# Patient Record
Sex: Female | Born: 1980 | Race: White | Hispanic: No | Marital: Married | Smoking: Never smoker
Health system: Southern US, Community
[De-identification: ages and names within clinical notes are randomized; demographics above are authoritative.]

## PROBLEM LIST (undated history)

## (undated) DIAGNOSIS — Z789 Other specified health status: Secondary | ICD-10-CM

## (undated) HISTORY — PX: NO PAST SURGERIES: SHX2092

---

## 2014-11-26 LAB — OB RESULTS CONSOLE ABO/RH: RH TYPE: POSITIVE

## 2014-11-26 LAB — OB RESULTS CONSOLE HIV ANTIBODY (ROUTINE TESTING): HIV: NONREACTIVE

## 2014-11-26 LAB — OB RESULTS CONSOLE HEPATITIS B SURFACE ANTIGEN: Hepatitis B Surface Ag: NEGATIVE

## 2014-11-26 LAB — OB RESULTS CONSOLE ANTIBODY SCREEN: Antibody Screen: NEGATIVE

## 2014-11-26 LAB — OB RESULTS CONSOLE RPR: RPR: NONREACTIVE

## 2014-11-26 LAB — OB RESULTS CONSOLE RUBELLA ANTIBODY, IGM: RUBELLA: IMMUNE

## 2014-11-29 LAB — OB RESULTS CONSOLE GC/CHLAMYDIA
Chlamydia: NEGATIVE
GC PROBE AMP, GENITAL: NEGATIVE

## 2015-04-03 NOTE — L&D Delivery Note (Signed)
Operative Delivery Note At 7:13 PM a viable and healthy female was delivered via Vaginal, Vacuum Investment banker, operational(Extractor).  Presentation: vertex; Position: Right,, Occiput,, Posterior; Station: +4.  Verbal consent: obtained from patient.  Risks and benefits discussed in detail.  Risks include, but are not limited to the risks of anesthesia, bleeding, infection, damage to maternal tissues, fetal cephalhematoma.  There is also the risk of inability to effect vaginal delivery of the head, or shoulder dystocia that cannot be resolved by established maneuvers, leading to the need for emergency cesarean section.  APGAR: 5, 7; weight  pending.   Placenta status: Intact, Spontaneous.   Cord: 3 vessels with the following complications: None.  Cord pH: na  Anesthesia: Epidural  Instruments: kiwi x 4 pulls , 2 pop offs Episiotomy: Median Lacerations: 3rd degree;Perineal Suture Repair: 2.0 vicryl rapide and 0 vicryl Est. Blood Loss (mL):  300  Mom to postpartum.  Baby to Couplet care / Skin to Skin.  Claire Holland 06/22/2015, 7:37 PM

## 2015-04-27 ENCOUNTER — Other Ambulatory Visit (HOSPITAL_COMMUNITY): Payer: Self-pay | Admitting: Obstetrics and Gynecology

## 2015-04-27 DIAGNOSIS — Z3689 Encounter for other specified antenatal screening: Secondary | ICD-10-CM

## 2015-04-27 DIAGNOSIS — O4402 Placenta previa specified as without hemorrhage, second trimester: Secondary | ICD-10-CM

## 2015-05-02 ENCOUNTER — Ambulatory Visit (HOSPITAL_COMMUNITY)
Admission: RE | Admit: 2015-05-02 | Discharge: 2015-05-02 | Disposition: A | Discharge status: Home / Self Care | Payer: BC Managed Care – PPO | Source: Ambulatory Visit | Attending: Obstetrics and Gynecology | Admitting: Obstetrics and Gynecology

## 2015-05-02 ENCOUNTER — Other Ambulatory Visit (HOSPITAL_COMMUNITY): Payer: Self-pay | Admitting: Obstetrics and Gynecology

## 2015-05-02 ENCOUNTER — Encounter (HOSPITAL_COMMUNITY): Payer: Self-pay

## 2015-05-02 DIAGNOSIS — Z3A33 33 weeks gestation of pregnancy: Secondary | ICD-10-CM

## 2015-05-02 DIAGNOSIS — Z3689 Encounter for other specified antenatal screening: Secondary | ICD-10-CM

## 2015-05-02 DIAGNOSIS — O4402 Placenta previa specified as without hemorrhage, second trimester: Secondary | ICD-10-CM

## 2015-05-02 DIAGNOSIS — O4443 Low lying placenta NOS or without hemorrhage, third trimester: Secondary | ICD-10-CM | POA: Diagnosis not present

## 2015-05-02 DIAGNOSIS — O4440 Low lying placenta NOS or without hemorrhage, unspecified trimester: Secondary | ICD-10-CM

## 2015-05-02 DIAGNOSIS — O283 Abnormal ultrasonic finding on antenatal screening of mother: Secondary | ICD-10-CM | POA: Insufficient documentation

## 2015-05-02 DIAGNOSIS — O09522 Supervision of elderly multigravida, second trimester: Secondary | ICD-10-CM

## 2015-05-02 DIAGNOSIS — O289 Unspecified abnormal findings on antenatal screening of mother: Secondary | ICD-10-CM

## 2015-05-02 HISTORY — DX: Other specified health status: Z78.9

## 2015-05-17 LAB — OB RESULTS CONSOLE GBS: STREP GROUP B AG: NEGATIVE

## 2015-06-06 ENCOUNTER — Encounter (HOSPITAL_COMMUNITY): Payer: Self-pay

## 2015-06-16 ENCOUNTER — Inpatient Hospital Stay (HOSPITAL_COMMUNITY): Admission: AD | Admit: 2015-06-16 | Payer: Self-pay | Source: Ambulatory Visit | Admitting: Obstetrics and Gynecology

## 2015-06-21 ENCOUNTER — Other Ambulatory Visit: Payer: Self-pay | Admitting: Obstetrics and Gynecology

## 2015-06-21 NOTE — H&P (Signed)
Claire Holland is a 35 y.o. female presenting for IOL due to postdates and polyhydramnios. Maternal Medical History:  Reason for admission: Contractions.   Contractions: Onset was less than 1 hour ago.   Frequency: rare.   Perceived severity is mild.    Fetal activity: Perceived fetal activity is normal.   Last perceived fetal movement was within the past hour.    Prenatal complications: Placental abnormality and polyhydramnios.   Prenatal Complications - Diabetes: none.    OB History    Gravida Para Term Preterm AB TAB SAB Ectopic Multiple Living   2 1 1       1      Past Medical History  Diagnosis Date  . Medical history non-contributory    Past Surgical History  Procedure Laterality Date  . No past surgeries     Family History: family history is not on file. Social History:  reports that she has never smoked. She does not have any smokeless tobacco history on file. She reports that she does not drink alcohol or use illicit drugs.   Prenatal Transfer Tool  Maternal Diabetes: No Genetic Screening: Normal Maternal Ultrasounds/Referrals: Abnormal:  Findings:   Other: marginal pci Fetal Ultrasounds or other Referrals:  None Maternal Substance Abuse:  No Significant Maternal Medications:  None Significant Maternal Lab Results:  None Other Comments:  None  Review of Systems  Constitutional: Negative.   All other systems reviewed and are negative.     Last menstrual period 09/09/2014. Maternal Exam:  Uterine Assessment: Contraction strength is mild.  Contraction frequency is rare.   Abdomen: Patient reports no abdominal tenderness. Fetal presentation: vertex  Introitus: Normal vulva. Normal vagina.  Ferning test: not done.  Nitrazine test: not done. Amniotic fluid character: not assessed.  Pelvis: questionable for delivery.   Cervix: Cervix evaluated by digital exam.     Physical Exam  Nursing note and vitals reviewed. Constitutional: She is oriented to  person, place, and time. She appears well-developed and well-nourished.  HENT:  Head: Normocephalic and atraumatic.  Neck: Normal range of motion. Neck supple.  Cardiovascular: Normal rate and regular rhythm.   Respiratory: Effort normal and breath sounds normal.  GI: Soft. Bowel sounds are normal.  Genitourinary: Vagina normal and uterus normal.  Musculoskeletal: Normal range of motion.  Neurological: She is alert and oriented to person, place, and time. She has normal reflexes.  Skin: Skin is warm and dry.  Psychiatric: She has a normal mood and affect.    Prenatal labs: ABO, Rh:   Antibody:   Rubella:   RPR:    HBsAg:    HIV:    GBS:     Assessment/Plan: Postdates Moderate poly Marginal PCI Admit for IOL   Claire Holland J 06/21/2015, 1:40 PM

## 2015-06-22 ENCOUNTER — Inpatient Hospital Stay (HOSPITAL_COMMUNITY): Payer: BC Managed Care – PPO | Admitting: Anesthesiology

## 2015-06-22 ENCOUNTER — Inpatient Hospital Stay (HOSPITAL_COMMUNITY)
Admission: AD | Admit: 2015-06-22 | Discharge: 2015-06-24 | DRG: 775 | Disposition: A | Discharge status: Home / Self Care | Payer: BC Managed Care – PPO | Source: Ambulatory Visit | Attending: Obstetrics and Gynecology | Admitting: Obstetrics and Gynecology

## 2015-06-22 ENCOUNTER — Encounter (HOSPITAL_COMMUNITY): Payer: Self-pay | Admitting: *Deleted

## 2015-06-22 DIAGNOSIS — Z3A40 40 weeks gestation of pregnancy: Secondary | ICD-10-CM

## 2015-06-22 DIAGNOSIS — O403XX0 Polyhydramnios, third trimester, not applicable or unspecified: Secondary | ICD-10-CM | POA: Diagnosis present

## 2015-06-22 DIAGNOSIS — O43123 Velamentous insertion of umbilical cord, third trimester: Secondary | ICD-10-CM | POA: Diagnosis present

## 2015-06-22 DIAGNOSIS — O480 Post-term pregnancy: Secondary | ICD-10-CM | POA: Diagnosis present

## 2015-06-22 DIAGNOSIS — O409XX0 Polyhydramnios, unspecified trimester, not applicable or unspecified: Secondary | ICD-10-CM | POA: Diagnosis present

## 2015-06-22 LAB — CBC
HEMATOCRIT: 42.1 % (ref 36.0–46.0)
Hemoglobin: 14.9 g/dL (ref 12.0–15.0)
MCH: 29.6 pg (ref 26.0–34.0)
MCHC: 35.4 g/dL (ref 30.0–36.0)
MCV: 83.7 fL (ref 78.0–100.0)
Platelets: 216 10*3/uL (ref 150–400)
RBC: 5.03 MIL/uL (ref 3.87–5.11)
RDW: 13.8 % (ref 11.5–15.5)
WBC: 8 10*3/uL (ref 4.0–10.5)

## 2015-06-22 LAB — TYPE AND SCREEN
ABO/RH(D): O POS
ANTIBODY SCREEN: NEGATIVE

## 2015-06-22 LAB — ABO/RH: ABO/RH(D): O POS

## 2015-06-22 LAB — RPR: RPR Ser Ql: NONREACTIVE

## 2015-06-22 MED ORDER — MISOPROSTOL 25 MCG QUARTER TABLET
25.0000 ug | ORAL_TABLET | ORAL | Status: DC | PRN
  Filled 2015-06-22: qty 0.25

## 2015-06-22 MED ORDER — LACTATED RINGERS IV SOLN
500.0000 mL | Freq: Once | INTRAVENOUS | Status: AC
  Administered 2015-06-22: 500 mL via INTRAVENOUS

## 2015-06-22 MED ORDER — IBUPROFEN 600 MG PO TABS
600.0000 mg | ORAL_TABLET | Freq: Four times a day (QID) | ORAL | Status: DC
  Administered 2015-06-23 – 2015-06-24 (×5): 600 mg via ORAL
  Filled 2015-06-22 (×5): qty 1

## 2015-06-22 MED ORDER — METHYLERGONOVINE MALEATE 0.2 MG/ML IJ SOLN: 0.2000 mg | INTRAMUSCULAR | Status: DC | PRN

## 2015-06-22 MED ORDER — WITCH HAZEL-GLYCERIN EX PADS: 1.0000 "application " | MEDICATED_PAD | CUTANEOUS | Status: DC | PRN

## 2015-06-22 MED ORDER — TETANUS-DIPHTH-ACELL PERTUSSIS 5-2.5-18.5 LF-MCG/0.5 IM SUSP
0.5000 mL | Freq: Once | INTRAMUSCULAR | Status: DC
  Filled 2015-06-22: qty 0.5

## 2015-06-22 MED ORDER — FENTANYL 2.5 MCG/ML BUPIVACAINE 1/10 % EPIDURAL INFUSION (WH - ANES)
14.0000 mL/h | INTRAMUSCULAR | Status: DC | PRN
  Administered 2015-06-22 (×3): 14 mL/h via EPIDURAL
  Filled 2015-06-22 (×2): qty 125

## 2015-06-22 MED ORDER — PHENYLEPHRINE 40 MCG/ML (10ML) SYRINGE FOR IV PUSH (FOR BLOOD PRESSURE SUPPORT)
80.0000 ug | PREFILLED_SYRINGE | INTRAVENOUS | Status: DC | PRN
  Filled 2015-06-22: qty 20

## 2015-06-22 MED ORDER — EPHEDRINE 5 MG/ML INJ: 10.0000 mg | INTRAVENOUS | Status: DC | PRN

## 2015-06-22 MED ORDER — ACETAMINOPHEN 325 MG PO TABS
650.0000 mg | ORAL_TABLET | ORAL | Status: DC | PRN
  Filled 2015-06-22: qty 2

## 2015-06-22 MED ORDER — OXYCODONE-ACETAMINOPHEN 5-325 MG PO TABS: 1.0000 | ORAL_TABLET | ORAL | Status: DC | PRN

## 2015-06-22 MED ORDER — OXYTOCIN 10 UNIT/ML IJ SOLN
1.0000 m[IU]/min | INTRAVENOUS | Status: DC
  Filled 2015-06-22: qty 10

## 2015-06-22 MED ORDER — METHYLERGONOVINE MALEATE 0.2 MG PO TABS: 0.2000 mg | ORAL_TABLET | ORAL | Status: DC | PRN

## 2015-06-22 MED ORDER — OXYTOCIN 10 UNIT/ML IJ SOLN: 2.5000 [IU]/h | INTRAVENOUS | Status: DC

## 2015-06-22 MED ORDER — OXYTOCIN BOLUS FROM INFUSION: 500.0000 mL | INTRAVENOUS | Status: DC

## 2015-06-22 MED ORDER — FLEET ENEMA 7-19 GM/118ML RE ENEM: 1.0000 | ENEMA | RECTAL | Status: DC | PRN

## 2015-06-22 MED ORDER — PHENYLEPHRINE 40 MCG/ML (10ML) SYRINGE FOR IV PUSH (FOR BLOOD PRESSURE SUPPORT): 80.0000 ug | PREFILLED_SYRINGE | INTRAVENOUS | Status: DC | PRN

## 2015-06-22 MED ORDER — SENNOSIDES-DOCUSATE SODIUM 8.6-50 MG PO TABS
2.0000 | ORAL_TABLET | ORAL | Status: DC
  Administered 2015-06-23: 2 via ORAL
  Filled 2015-06-22: qty 2

## 2015-06-22 MED ORDER — LANOLIN HYDROUS EX OINT: TOPICAL_OINTMENT | CUTANEOUS | Status: DC | PRN

## 2015-06-22 MED ORDER — BENZOCAINE-MENTHOL 20-0.5 % EX AERO
1.0000 "application " | INHALATION_SPRAY | CUTANEOUS | Status: DC | PRN
  Administered 2015-06-23: 1 via TOPICAL
  Filled 2015-06-22 (×2): qty 56

## 2015-06-22 MED ORDER — ONDANSETRON HCL 4 MG PO TABS: 4.0000 mg | ORAL_TABLET | ORAL | Status: DC | PRN

## 2015-06-22 MED ORDER — DIPHENHYDRAMINE HCL 25 MG PO CAPS: 25.0000 mg | ORAL_CAPSULE | Freq: Four times a day (QID) | ORAL | Status: DC | PRN

## 2015-06-22 MED ORDER — FENTANYL CITRATE (PF) 100 MCG/2ML IJ SOLN: 50.0000 ug | INTRAMUSCULAR | Status: DC | PRN

## 2015-06-22 MED ORDER — LACTATED RINGERS IV SOLN
INTRAVENOUS | Status: DC
Start: 2015-06-22 — End: 2015-06-22
  Administered 2015-06-22: 02:00:00 via INTRAVENOUS

## 2015-06-22 MED ORDER — CITRIC ACID-SODIUM CITRATE 334-500 MG/5ML PO SOLN
30.0000 mL | ORAL | Status: DC | PRN
  Filled 2015-06-22: qty 15

## 2015-06-22 MED ORDER — SIMETHICONE 80 MG PO CHEW: 80.0000 mg | CHEWABLE_TABLET | ORAL | Status: DC | PRN

## 2015-06-22 MED ORDER — LACTATED RINGERS IV SOLN
500.0000 mL | INTRAVENOUS | Status: DC | PRN
  Administered 2015-06-22 (×2): 500 mL via INTRAVENOUS

## 2015-06-22 MED ORDER — ZOLPIDEM TARTRATE 5 MG PO TABS: 5.0000 mg | ORAL_TABLET | Freq: Every evening | ORAL | Status: DC | PRN

## 2015-06-22 MED ORDER — OXYTOCIN 10 UNIT/ML IJ SOLN
1.0000 m[IU]/min | INTRAVENOUS | Status: DC
  Administered 2015-06-22: 2 m[IU]/min via INTRAVENOUS

## 2015-06-22 MED ORDER — ONDANSETRON HCL 4 MG/2ML IJ SOLN: 4.0000 mg | Freq: Four times a day (QID) | INTRAMUSCULAR | Status: DC | PRN

## 2015-06-22 MED ORDER — TERBUTALINE SULFATE 1 MG/ML IJ SOLN
0.2500 mg | Freq: Once | INTRAMUSCULAR | Status: AC | PRN
  Administered 2015-06-22: 0.25 mg via SUBCUTANEOUS

## 2015-06-22 MED ORDER — LIDOCAINE HCL (PF) 1 % IJ SOLN
INTRAMUSCULAR | Status: DC | PRN
  Administered 2015-06-22: 8 mL
  Administered 2015-06-22: 8 mL via EPIDURAL

## 2015-06-22 MED ORDER — DIPHENHYDRAMINE HCL 50 MG/ML IJ SOLN: 12.5000 mg | INTRAMUSCULAR | Status: DC | PRN

## 2015-06-22 MED ORDER — PRENATAL MULTIVITAMIN CH
1.0000 | ORAL_TABLET | Freq: Every day | ORAL | Status: DC
  Administered 2015-06-23 – 2015-06-24 (×2): 1 via ORAL
  Filled 2015-06-22 (×2): qty 1

## 2015-06-22 MED ORDER — ACETAMINOPHEN 325 MG PO TABS: 650.0000 mg | ORAL_TABLET | ORAL | Status: DC | PRN

## 2015-06-22 MED ORDER — ONDANSETRON HCL 4 MG/2ML IJ SOLN: 4.0000 mg | INTRAMUSCULAR | Status: DC | PRN

## 2015-06-22 MED ORDER — TERBUTALINE SULFATE 1 MG/ML IJ SOLN
0.2500 mg | Freq: Once | INTRAMUSCULAR | Status: DC | PRN
  Filled 2015-06-22: qty 1

## 2015-06-22 MED ORDER — ZOLPIDEM TARTRATE 5 MG PO TABS
5.0000 mg | ORAL_TABLET | Freq: Every evening | ORAL | Status: DC | PRN
Start: 2015-06-22 — End: 2015-06-22

## 2015-06-22 MED ORDER — OXYCODONE-ACETAMINOPHEN 5-325 MG PO TABS: 2.0000 | ORAL_TABLET | ORAL | Status: DC | PRN

## 2015-06-22 MED ORDER — LIDOCAINE HCL (PF) 1 % IJ SOLN: 30.0000 mL | INTRAMUSCULAR | Status: DC | PRN

## 2015-06-22 MED ORDER — DIBUCAINE 1 % RE OINT
1.0000 "application " | TOPICAL_OINTMENT | RECTAL | Status: DC | PRN
  Filled 2015-06-22: qty 28

## 2015-06-22 MED ORDER — OXYTOCIN 10 UNIT/ML IJ SOLN: 1.0000 m[IU]/min | INTRAVENOUS | Status: DC

## 2015-06-22 NOTE — Anesthesia Procedure Notes (Signed)
Epidural Patient location during procedure: OB Start time: 06/22/2015 9:12 AM End time: 06/22/2015 9:16 AM  Staffing Anesthesiologist: Leilani AbleHATCHETT, Latorie Montesano Performed by: anesthesiologist   Preanesthetic Checklist Completed: patient identified, site marked, surgical consent, pre-op evaluation, timeout performed, IV checked, risks and benefits discussed and monitors and equipment checked  Epidural Patient position: sitting Prep: site prepped and draped and DuraPrep Patient monitoring: continuous pulse ox and blood pressure Approach: midline Location: L3-L4 Injection technique: LOR air  Needle:  Needle type: Tuohy  Needle gauge: 17 G Needle length: 9 cm and 9 Needle insertion depth: 5 cm cm Catheter type: closed end flexible Catheter size: 19 Gauge Catheter at skin depth: 10 cm Test dose: negative and Other  Assessment Sensory level: T9 Events: blood not aspirated, injection not painful, no injection resistance, negative IV test and no paresthesia

## 2015-06-22 NOTE — Progress Notes (Signed)
Claire Holland is a 35 y.o. G2P1001 at 7237w6d by LMP admitted for induction of labor due to poly and marginal pci.  Subjective: comfortable  Objective: BP 109/62 mmHg  Pulse 76  Temp(Src) 97.9 F (36.6 C) (Oral)  Resp 18  Ht 5\' 1"  (1.549 m)  Wt 68.04 kg (150 lb)  BMI 28.36 kg/m2  SpO2 100%  LMP 09/09/2014      FHT:  FHR: 135 bpm, variability: moderate,  accelerations:  Present,  decelerations:  Present occ early  Previously noted bradycardia of ? etx now resolved. UC:   irregular, every 2-5 minutes SVE:   Dilation: 5 Effacement (%): 100 Station: 0, +1 Exam by:: Dr.Issabela Lesko  IUPC placed without difficulty  Labs: Lab Results  Component Value Date   WBC 8.0 06/22/2015   HGB 14.9 06/22/2015   HCT 42.1 06/22/2015   MCV 83.7 06/22/2015   PLT 216 06/22/2015    Assessment / Plan: Induction of labor due to marginal pci,  progressing well on pitocin  Labor: Progressing normally Preeclampsia:  no signs or symptoms of toxicity Fetal Wellbeing:  Category I Pain Control:  Epidural I/D:  n/a Anticipated MOD:  guarded  Leonardo Plaia J 06/22/2015, 12:50 PM

## 2015-06-22 NOTE — Progress Notes (Signed)
Claire Holland is a 35 y.o. G2P1001 at 6522w6d by LMP admitted for induction of labor due to marginal pci, postdates and poly.  Subjective: Mild contractions  Objective: BP 103/65 mmHg  Pulse 70  Temp(Src) 97.9 F (36.6 C) (Oral)  Resp 18  Ht 5\' 1"  (1.549 m)  Wt 68.04 kg (150 lb)  BMI 28.36 kg/m2  LMP 09/09/2014      FHT:  FHR: 145 bpm, variability: moderate,  accelerations:  Present,  decelerations:  Absent UC:   regular, every 2 minutes SVE:   2/80/-1 AROM clear  Labs: Lab Results  Component Value Date   WBC 8.0 06/22/2015   HGB 14.9 06/22/2015   HCT 42.1 06/22/2015   MCV 83.7 06/22/2015   PLT 216 06/22/2015    Assessment / Plan: Induction of labor due to postterm,  progressing well on pitocin  Labor: Progressing normally Preeclampsia:  no signs or symptoms of toxicity Fetal Wellbeing:  Category I Pain Control:  Labor support without medications I/D:  n/a Anticipated MOD:  guarded  Joanell Cressler J 06/22/2015, 7:44 AM

## 2015-06-22 NOTE — Anesthesia Preprocedure Evaluation (Signed)

## 2015-06-23 ENCOUNTER — Encounter (HOSPITAL_COMMUNITY): Payer: Self-pay | Admitting: Obstetrics and Gynecology

## 2015-06-23 LAB — CBC
HCT: 30 % — ABNORMAL LOW (ref 36.0–46.0)
HEMOGLOBIN: 10.2 g/dL — AB (ref 12.0–15.0)
MCH: 29.1 pg (ref 26.0–34.0)
MCHC: 34 g/dL (ref 30.0–36.0)
MCV: 85.7 fL (ref 78.0–100.0)
Platelets: 143 10*3/uL — ABNORMAL LOW (ref 150–400)
RBC: 3.5 MIL/uL — ABNORMAL LOW (ref 3.87–5.11)
RDW: 14.3 % (ref 11.5–15.5)
WBC: 20.2 10*3/uL — AB (ref 4.0–10.5)

## 2015-06-23 LAB — CCBB MATERNAL DONOR DRAW

## 2015-06-23 MED ORDER — INFLUENZA VAC SPLIT QUAD 0.5 ML IM SUSY
0.5000 mL | PREFILLED_SYRINGE | INTRAMUSCULAR | Status: AC
  Administered 2015-06-24: 0.5 mL via INTRAMUSCULAR
  Filled 2015-06-23: qty 0.5

## 2015-06-23 NOTE — Progress Notes (Signed)
Patient ID: Claire HuntsmanMeagan N Holland, female   DOB: 02/22/1981, 35 y.o.   MRN: [id number] PPD # 1 SVD  S:  Reports feeling well             Tolerating po/ No nausea or vomiting             Bleeding is light             Pain minimally controlled with ibuprofen (OTC)             Up ad lib / ambulatory / voiding without difficulties    Newborn  Information for the patient's newborn:  Claire Holland, Boy Claire Holland [[id number]]  female  breast feeding  / Circumcision planning   O:  A & O x 3, in no apparent distress              VS:  Filed Vitals:   06/22/15 2058 06/22/15 2130 06/22/15 2245 06/23/15 0300  BP:  118/56 116/48 92/56  Pulse:  90 70 78  Temp:  98.1 F (36.7 C) 98.4 F (36.9 C) 97.8 F (36.6 C)  TempSrc:  Oral Oral Oral  Resp: 16 18 18 18   Height:      Weight:      SpO2:  99% 98% 98%    LABS:  Recent Labs  06/22/15 0200 06/23/15 0607  WBC 8.0 20.2*  HGB 14.9 10.2*  HCT 42.1 30.0*  PLT 216 143*    Blood type: --/--/O POS, O POS (03/22 0200)  Rubella: Immune (08/26 0000)   I&O: I/O last 3 completed shifts: In: -  Out: 800 [Urine:500; Blood:300]             Lungs: Clear and unlabored  Heart: regular rate and rhythm / no murmurs  Abdomen: soft, non-tender, non-distended             Fundus: firm, non-tender, U-even  Perineum: 3rd degree repair healing well - no edema  Lochia: minimal  Extremities: Trace pedal edema, no calf pain or tenderness, No Homans    A/P: PPD # 1  35 y.o., [id number]   Principal Problem:   Postpartum care following vaginal delivery (3/22) Active Problems:   Polyhydramnios   Third degree laceration of perineum during delivery, postpartum   Doing well - stable status  Routine post partum orders  Reminded of Percocet order prn  Continue stool softener  Anticipate discharge tomorrow    Raelyn MoraAWSON, Alessio Bogan, M, MSN, CNM 06/23/2015, 8:29 AM

## 2015-06-23 NOTE — Lactation Note (Signed)
This note was copied from a baby's chart. Lactation Consultation Note Follow up visit at 20 hours of age.  Mom has baby latched now and baby was more eager at beginning of feeding.  Mom stimulating baby now to maintain feeding.  Mom reports circumcision was done this am and baby has been sleepy.  Encouraged mom to call for assist as needed and discussed cluster feeding.   Patient Name: Claire Holland QMVHQ'IToday's Date: 06/23/2015 Reason for consult: Follow-up assessment   Maternal Data Has patient been taught Hand Expression?: Yes Does the patient have breastfeeding experience prior to this delivery?: No  Feeding Feeding Type: Breast Fed Length of feed:  (15 minutes)  LATCH Score/Interventions Latch: Grasps breast easily, tongue down, lips flanged, rhythmical sucking. Intervention(s): Adjust position;Assist with latch;Breast massage;Breast compression  Audible Swallowing: A few with stimulation Intervention(s): Skin to skin;Hand expression;Alternate breast massage  Type of Nipple: Everted at rest and after stimulation  Comfort (Breast/Nipple): Soft / non-tender     Hold (Positioning): No assistance needed to correctly position infant at breast. Intervention(s): Breastfeeding basics reviewed;Support Pillows;Position options;Skin to skin  LATCH Score: 9  Lactation Tools Discussed/Used     Consult Status Consult Status: Follow-up Follow-up type: In-patient    Tyffany Waldrop, Arvella MerlesJana Lynn 06/23/2015, 4:00 PM

## 2015-06-23 NOTE — Anesthesia Postprocedure Evaluation (Signed)
Anesthesia Post Note  Patient: Claire Holland  Procedure(s) Performed: * No procedures listed *  Patient location during evaluation: Mother Baby Anesthesia Type: Epidural Level of consciousness: awake, awake and alert, oriented and patient cooperative Pain management: pain level controlled Vital Signs Assessment: post-procedure vital signs reviewed and stable Respiratory status: spontaneous breathing, nonlabored ventilation and respiratory function stable Cardiovascular status: stable Postop Assessment: no headache, no backache, patient able to bend at knees and no signs of nausea or vomiting Anesthetic complications: no    Last Vitals:  Filed Vitals:   06/22/15 2245 06/23/15 0300  BP: 116/48 92/56  Pulse: 70 78  Temp: 36.9 C 36.6 C  Resp: 18 18    Last Pain:  Filed Vitals:   06/23/15 0352  PainSc: 0-No pain                 Shannia Jacuinde L

## 2015-06-23 NOTE — Lactation Note (Addendum)
This note was copied from a baby's chart. Lactation Consultation Note Mom's 2nd baby but didn't BF her 1st baby. Mom states this baby is latch really well w/o pain. Mom is BF in cradle and football positions. Educated about newborn behavior, STS, I&O, cluster feeding, supply and demand. Mom encouraged to feed baby 8-12 times/24 hours and with feeding cues. Mom encouraged to waken baby for feeds. Mom has everted short shaft nipples but denies painful latches.  Referred to Baby and Me Book in Breastfeeding section Pg. 22-23 for position options and Proper latch demonstration. Mom states RN taught her hand expression and saw colostrum. WH/LC brochure given w/resources, support groups and LC services. Patient Name: Claire Holland WUJWJ'XToday's Date: 06/23/2015 Reason for consult: Initial assessment   Maternal Data Has patient been taught Hand Expression?: Yes Does the patient have breastfeeding experience prior to this delivery?: No  Feeding Feeding Type: Breast Fed Length of feed: 15 min  LATCH Score/Interventions       Type of Nipple: Everted at rest and after stimulation  Comfort (Breast/Nipple): Soft / non-tender     Hold (Positioning): No assistance needed to correctly position infant at breast. Intervention(s): Breastfeeding basics reviewed;Support Pillows;Position options;Skin to skin     Lactation Tools Discussed/Used     Consult Status Consult Status: Follow-up Date: 06/24/15 Follow-up type: In-patient    Brahm Barbeau, Diamond NickelLAURA G 06/23/2015, 5:10 AM

## 2015-06-24 MED ORDER — DOCUSATE SODIUM 100 MG PO CAPS
200.0000 mg | ORAL_CAPSULE | Freq: Two times a day (BID) | ORAL | Status: DC
  Administered 2015-06-24: 200 mg via ORAL
  Filled 2015-06-24 (×2): qty 2

## 2015-06-24 MED ORDER — IBUPROFEN 800 MG PO TABS: 800.0000 mg | ORAL_TABLET | Freq: Three times a day (TID) | ORAL | Status: AC

## 2015-06-24 MED ORDER — IBUPROFEN 800 MG PO TABS: 800.0000 mg | ORAL_TABLET | Freq: Three times a day (TID) | ORAL | Status: DC

## 2015-06-24 MED ORDER — DOCUSATE SODIUM 250 MG PO CAPS: 250.0000 mg | ORAL_CAPSULE | Freq: Two times a day (BID) | ORAL | Status: AC

## 2015-06-24 MED ORDER — MAGNESIUM 400 MG PO TABS: 400.0000 mg | ORAL_TABLET | Freq: Every day | ORAL | Status: AC

## 2015-06-24 NOTE — Progress Notes (Signed)
PPD 2 SVD with 3rd degree repair  S:  Reports feeling ok             Tolerating po/ No nausea or vomiting             Bleeding is light             Pain controlled withmotrin and percocet             Up ad lib / ambulatory / voiding QS  Newborn breast feeding   O:               VS: BP 95/68 mmHg  Pulse 73  Temp(Src) 97.5 F (36.4 C) (Oral)  Resp 16  Ht 5\' 1"  (1.549 m)  Wt 68.04 kg (150 lb)  BMI 28.36 kg/m2  SpO2 100%  LMP 09/09/2014  Breastfeeding? Unknown   LABS:              Recent Labs  06/22/15 0200 06/23/15 0607  WBC 8.0 20.2*  HGB 14.9 10.2*  PLT 216 143*               Blood type: --/--/O POS, O POS (03/22 0200)  Rubella: Immune (08/26 0000)                           Physical Exam:             Alert and oriented X3  Abdomen: soft, non-tender, non-distended              Fundus: firm, non-tender, U-1  Perineum: mild edema  Lochia: light  Extremities: trace edema, no calf pain or tenderness    A: PPD # 2 with 3rd degree repair   Doing well - stable status  P: Routine post partum orders             Stool softeners x 6 weeks  DC home - WOB booklet - instructions reviewed  [name], Tristram Milian CNM, MSN, FACNM 06/24/2015, 7:59 AM

## 2015-06-24 NOTE — Lactation Note (Signed)
This note was copied from a baby's chart. Lactation Consultation Note  Baby latched in cradle position after approx 1 hour,  Baby has been cluster feeding. Answered questions about pumping and going back to work. Reviewed engorgement care and monitoring voids/stools. Encouraged mother to attend support group.  Patient Name: Claire Sanjuan [name] WUJWJ'XToday's Date: 06/24/2015 Reason for consult: Follow-up assessment   Maternal Data    Feeding Feeding Type: Breast Fed Length of feed: 60 min (cluster)  LATCH Score/Interventions Latch: Grasps breast easily, tongue down, lips flanged, rhythmical sucking. Intervention(s): Adjust position;Assist with latch;Breast compression  Audible Swallowing: A few with stimulation Intervention(s): Hand expression  Type of Nipple: Everted at rest and after stimulation  Comfort (Breast/Nipple): Filling, red/small blisters or bruises, mild/mod discomfort  Problem noted: Mild/Moderate discomfort Interventions  (Cracked/bleeding/bruising/blister): Expressed breast milk to nipple Interventions (Mild/moderate discomfort): Hand expression  Hold (Positioning): No assistance needed to correctly position infant at breast. Intervention(s): Breastfeeding basics reviewed;Support Pillows;Position options;Skin to skin  LATCH Score: 8  Lactation Tools Discussed/Used     Consult Status Consult Status: Complete    Hardie PulleyBerkelhammer, Byanca Kasper Boschen 06/24/2015, 8:52 AM

## 2015-06-24 NOTE — Discharge Summary (Signed)
Obstetric Discharge Summary Reason for Admission: induction of labor Prenatal Procedures: none Intrapartum Procedures: spontaneous vaginal delivery with 3rd degree extension from episiotomy / Vac assist /  epidural Postpartum Procedures: none Complications-Operative and Postpartum: 3rd degree perineal laceration HEMOGLOBIN  Date Value Ref Range Status  06/23/2015 10.2* 12.0 - 15.0 g/dL Final    Comment:    DELTA CHECK NOTED REPEATED TO VERIFY    HCT  Date Value Ref Range Status  06/23/2015 30.0* 36.0 - 46.0 % Final    Physical Exam:  General: alert, cooperative and no distress Lochia: appropriate Uterine Fundus: firm Incision: healing well DVT Evaluation: No evidence of DVT seen on physical exam.  Discharge Diagnoses: Term Pregnancy-delivered  Discharge Information: Date: 06/24/2015 Activity: pelvic rest Diet: routine Medications: PNV, Ibuprofen, Colace, Percocet and magnesium Condition: stable Instructions: refer to practice specific booklet Discharge to: home Follow-up Information    Follow up with Claire Holland,Claire J, MD. Schedule an appointment as soon as possible for a visit in 6 weeks.   Specialty:  Obstetrics and Gynecology   Contact information:   [address] [place] [id number] [phone]       Newborn Data: Live born female  Birth Weight: 8 lb 2.5 oz (3700 g) APGAR: 5, 7  Home with mother.  Claire Holland, Claire Holland 06/24/2015, 8:48 AM

## 2017-04-29 IMAGING — US US MFM OB TRANSVAGINAL
1 series · 13 of 28 positions shown · non-contrast
Comparison: none

pm)

Name:       FELIX ARNALDO [facility]                      Visit  05/02/2015 [DATE]
Date:
Infertility
8156 [REDACTED]
1  STRAUSS TRAUT           [id number]       [id number]     [id number]
2  STRAUSS TRAUT           [id number]       [id number]     [id number]
Indications
33 weeks gestation of pregnancy
Detailed fetal anatomic survey                  Z36
Low lying placenta, antepartum
Abnormal first trimester screen for T21, low
risk NIPS
Advanced maternal age multigravida 34,
second trimester
OB History
Gravidity:     2         Term:  1        Prem:    0        SAB:   0
TOP:           0       Ectopic  0        Living:  1
:
Fetal Evaluation
Num Of Fetuses:      1
Fetal Heart          127
Rate(bpm):
Cardiac Activity:    Observed
Presentation:        Cephalic
Placenta:            Posterior, above cervical os
P. Cord Insertion:   Visualized, fundal right
Amniotic Fluid
AFI FV:      Subjectively within normal limits
AFI Sum:     17.01    cm      62  %Tile     Larg Pckt:    7.08   cm
RUQ:   7.08    cm    RLQ:   2.99    cm   LUQ:    3.42    cm   LLQ:    3.52   cm
Biometry
BPD:        80  mm     G. Age:   32w 1d                  CI:        72.69   %    70 - 86
FL/HC:      20.5   %    19.4 -
HC:      298.4  mm     G. Age:   33w 0d          9  %    HC/AC:      0.99        0.96 -
AC:        300  mm     G. Age:   34w 0d        64   %    FL/BPD      76.4   %    71 - 87
FL:       61.1  mm     G. Age:   31w 5d          6  %    FL/AC:      20.4   %    20 - 24
HUM:      54.6  mm     G. Age:   31w 5d        21   %
CM:        7.7  mm
Est.        1777   gm   4 lb 10 oz      48   %
FW:
Gestational Age
LMP:           33w 4d        Date:  09/09/14                  EDD:   06/16/15
U/S Today:     32w 5d                                         EDD:   06/22/15
Best:          33w 4d    Det. By:   LMP  (09/09/14)           EDD:   06/16/15
Anatomy
Cranium:          Appears normal         Aortic Arch:       Appears normal
Fetal Cavum:      Appears normal         Ductal Arch:       Appears normal
Ventricles:       Appears normal         Diaphragm:         Appears normal
Choroid Plexus:   Appears normal         Stomach:           Appears normal,
left sided
Cerebellum:       Appears normal         Abdomen:           Appears normal
Posterior         Appears normal         Abdominal          Not well visualized
Fossa:                                   Wall:
Nuchal Fold:      Not applicable (>20    Cord Vessels:      Appears normal (3
wks GA)                                   vessel cord)
Face:             Appears normal         Kidneys:           Appear normal
(orbits and profile)
Lips:             Appears normal         Bladder:           Appears normal
Fetal Thoracic:   Appears normal         Spine:             Appears normal
Heart:            Not well visualized    Upper              Appears normal
Extremities:
RVOT:             Not well visualized    Lower              Appears normal
LVOT:             Not well visualized
Other:   Technically difficult due to advanced GA and fetal position.
Cervix Uterus Adnexa
Cervix
Length:               3  cm.
Measured transvaginally.
Impression
INDICATION: 34 yr old 6CNTHHT at 99w7d with concern for
low lying placenta for fetal ultrasound.

[Series 1: us mfm ob transvaginal · 80 acquisitions, 13 frames shown]
[im 3/80]
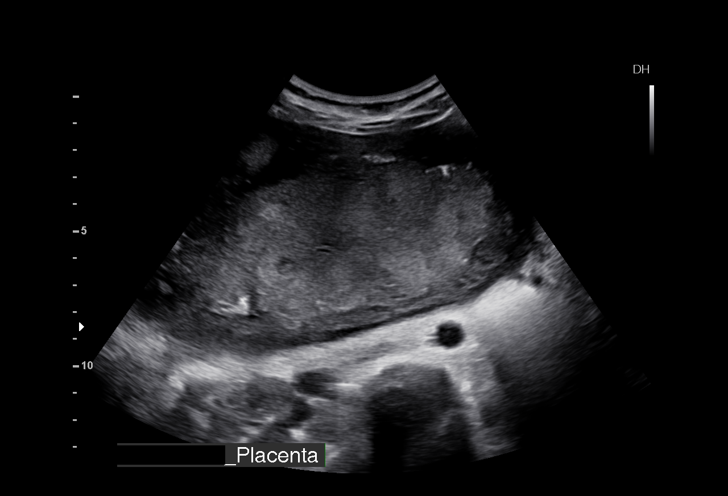
[im 9/80]
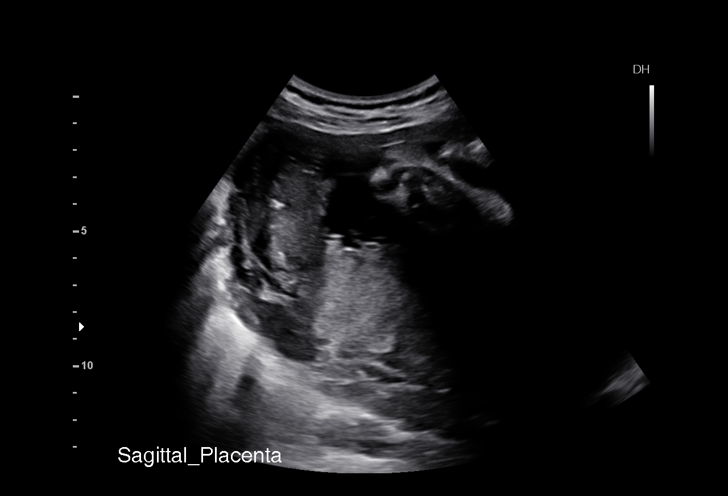
[im 15/80]
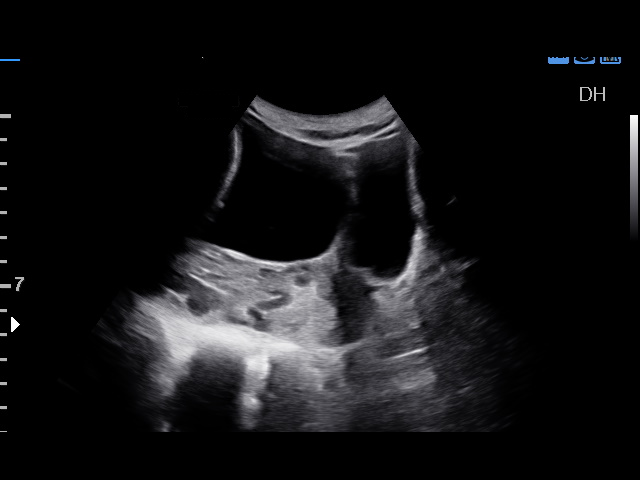
[im 21/80]
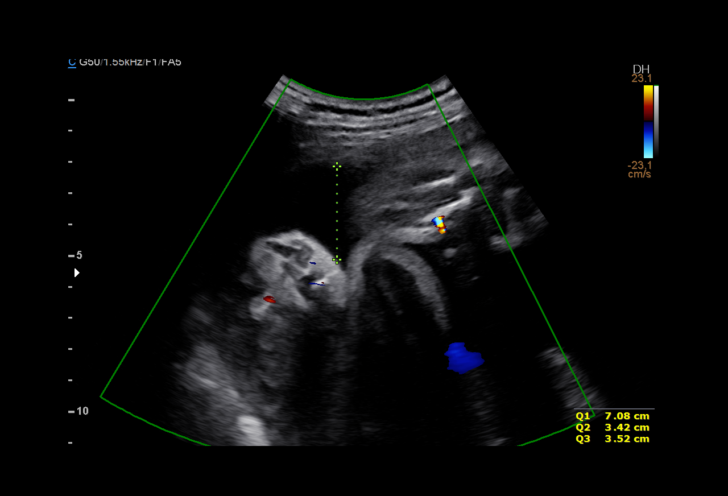
[im 27/80]
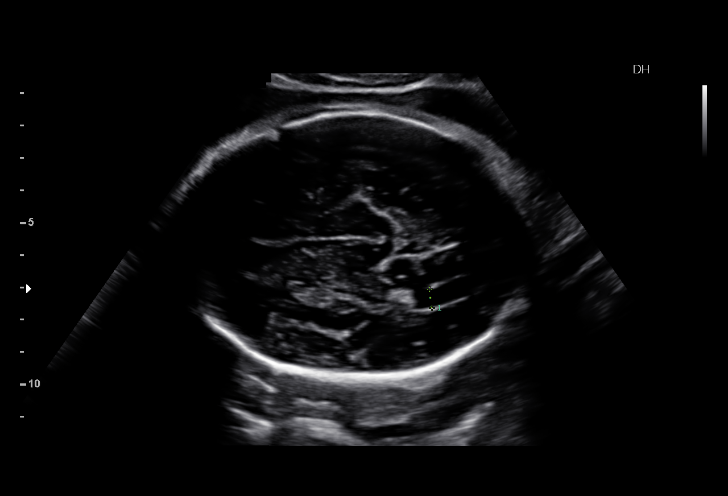
[im 33/80]
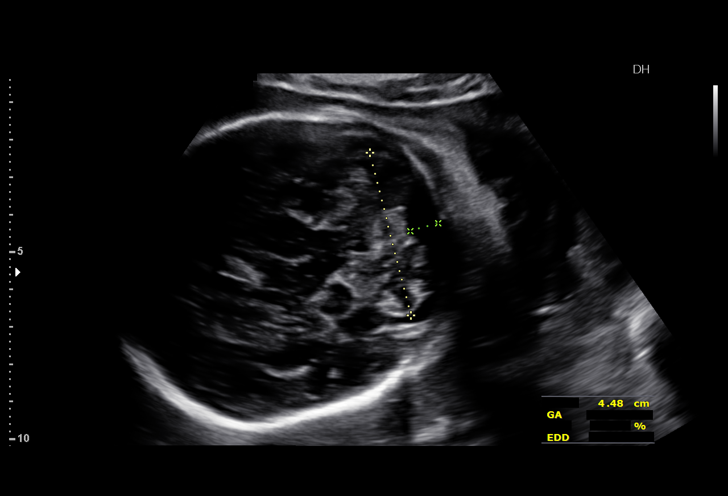
[im 41/80]
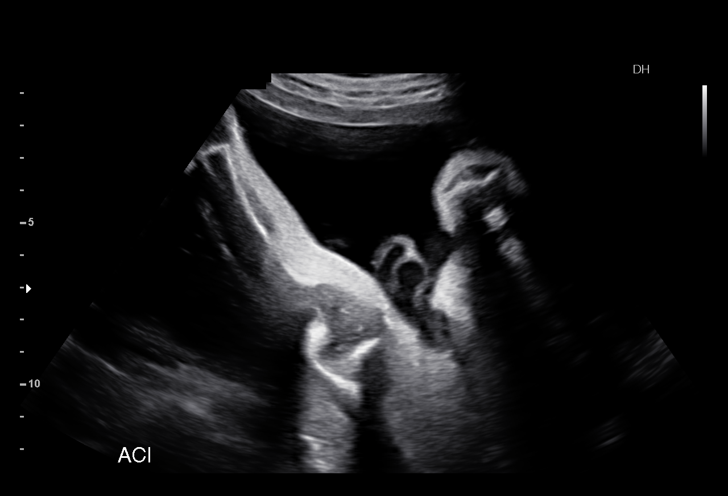
[im 47/80]
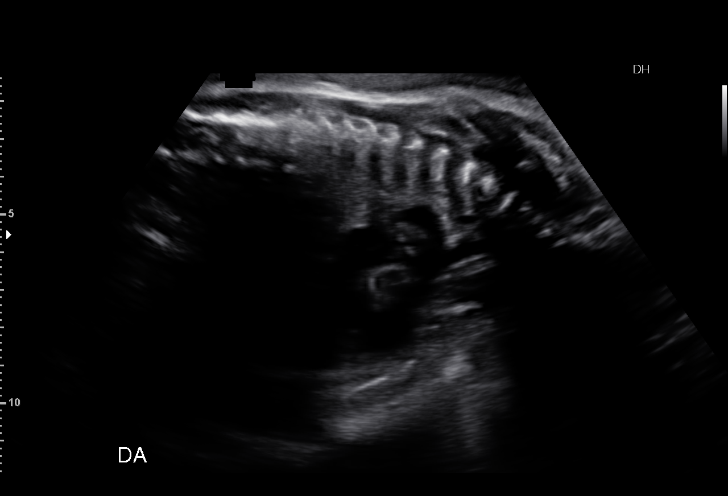
[im 53/80]
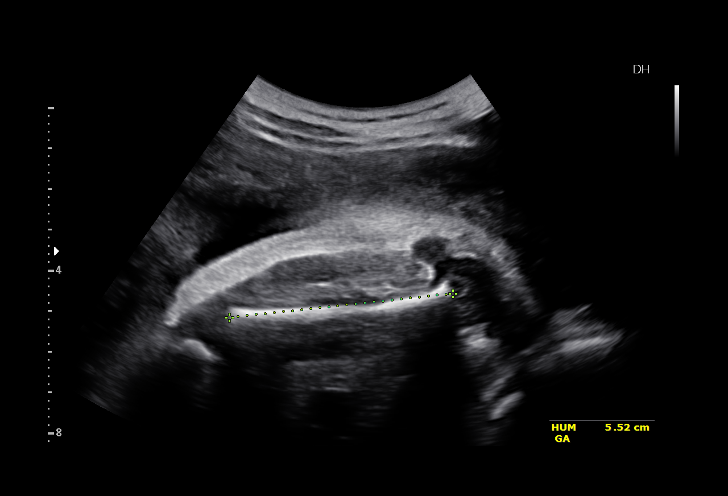
[im 59/80]
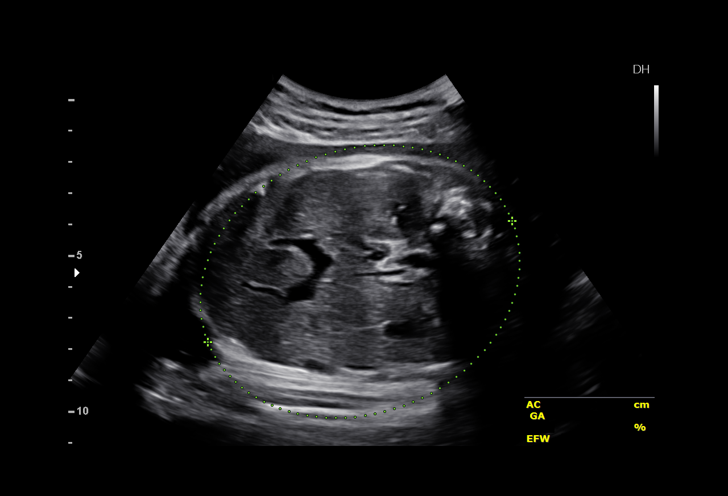
[im 65/80]
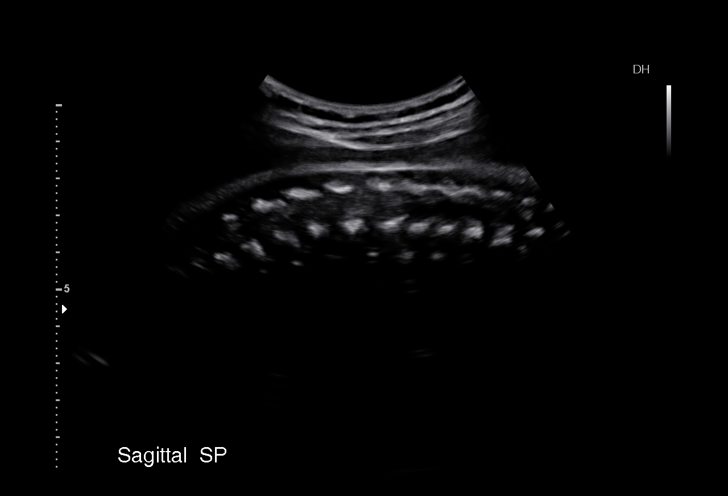
[im 71/80]
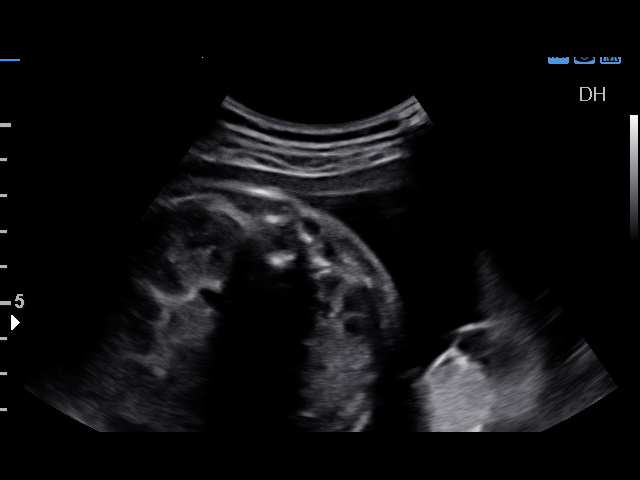
[im 77/80]
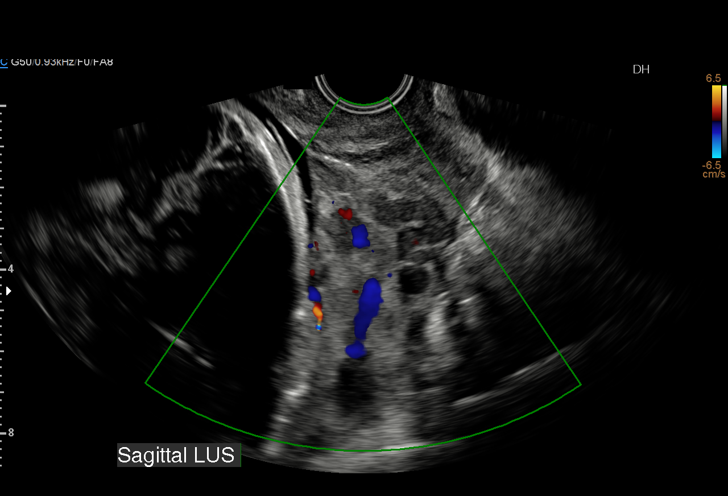

[13 of 28 positions shown; findings below may reference images not displayed]

FINDINGS: 1. Single intrauterine pregnancy.
2. Estimated fetal weight is in the 48th%.
3. Posterior placenta without evidence of previa.
4. Normal amniotic fluid volume.
5. Normal transvaginal cervical length. There are maternal
dilated veins adjacent to the cervix external to the uterus.
6. The views of the heart, cord insertion, hands, and ankles
are limited.
7. The remainder of the limited anatomy survey is normal.
Recommendations

1. Appropriate fetal growth.
2. Normal limited anatomy survey:
- had anatomic survey with primary OB
- follow up if desired to complete anatomy in 3-4 weeks (not
scheduled)
3. Advanced maternal age:
- had low risk cell free fetal DNA
- we do not have the results of the first trimester screen
4. Normal AFP
5. Dilated maternal veins near cervix:
- no increased surveillance or altered management is
indicated
- common variant
- the lower uterine segment is clear
